# Patient Record
Sex: Female | Born: 2000 | Race: Asian | Hispanic: Yes | Marital: Single | Smoking: Current every day smoker
Health system: Southern US, Community
[De-identification: ages and names within clinical notes are randomized; demographics above are authoritative.]

---

## 2019-09-16 ENCOUNTER — Emergency Department (HOSPITAL_COMMUNITY): Payer: Self-pay

## 2019-09-16 ENCOUNTER — Other Ambulatory Visit: Payer: Self-pay

## 2019-09-16 ENCOUNTER — Encounter (HOSPITAL_COMMUNITY): Payer: Self-pay | Admitting: Emergency Medicine

## 2019-09-16 ENCOUNTER — Emergency Department (HOSPITAL_COMMUNITY)
Admission: EM | Admit: 2019-09-16 | Discharge: 2019-09-16 | Disposition: A | Discharge status: Home / Self Care | Payer: Self-pay | Attending: Emergency Medicine | Admitting: Emergency Medicine

## 2019-09-16 DIAGNOSIS — F17210 Nicotine dependence, cigarettes, uncomplicated: Secondary | ICD-10-CM | POA: Insufficient documentation

## 2019-09-16 DIAGNOSIS — R319 Hematuria, unspecified: Secondary | ICD-10-CM

## 2019-09-16 DIAGNOSIS — R109 Unspecified abdominal pain: Secondary | ICD-10-CM | POA: Insufficient documentation

## 2019-09-16 DIAGNOSIS — N390 Urinary tract infection, site not specified: Secondary | ICD-10-CM | POA: Insufficient documentation

## 2019-09-16 LAB — CBC
HCT: 42.4 % (ref 36.0–46.0)
Hemoglobin: 14.4 g/dL (ref 12.0–15.0)
MCH: 31.3 pg (ref 26.0–34.0)
MCHC: 34 g/dL (ref 30.0–36.0)
MCV: 92.2 fL (ref 80.0–100.0)
Platelets: 197 10*3/uL (ref 150–400)
RBC: 4.6 MIL/uL (ref 3.87–5.11)
RDW: 11.9 % (ref 11.5–15.5)
WBC: 10.3 10*3/uL (ref 4.0–10.5)
nRBC: 0 % (ref 0.0–0.2)

## 2019-09-16 LAB — URINALYSIS, ROUTINE W REFLEX MICROSCOPIC
Bilirubin Urine: NEGATIVE
Glucose, UA: NEGATIVE mg/dL
Ketones, ur: NEGATIVE mg/dL
Nitrite: POSITIVE — AB
Protein, ur: 100 mg/dL — AB
RBC / HPF: >50 RBC/hpf — ABNORMAL HIGH (ref 0–5)
Specific Gravity, Urine: 1.018 (ref 1.005–1.030)
WBC, UA: >50 WBC/hpf — ABNORMAL HIGH (ref 0–5)
pH: 6 (ref 5.0–8.0)

## 2019-09-16 LAB — COMPREHENSIVE METABOLIC PANEL
ALT: 15 U/L (ref 0–44)
AST: 18 U/L (ref 15–41)
Albumin: 4.6 g/dL (ref 3.5–5.0)
Alkaline Phosphatase: 55 U/L (ref 38–126)
Anion gap: 13 (ref 5–15)
BUN: 14 mg/dL (ref 6–20)
CO2: 24 mmol/L (ref 22–32)
Calcium: 9.8 mg/dL (ref 8.9–10.3)
Chloride: 101 mmol/L (ref 98–111)
Creatinine, Ser: 0.85 mg/dL (ref 0.44–1.00)
GFR calc Af Amer: >60 mL/min (ref >60)
GFR calc non Af Amer: >60 mL/min (ref >60)
Glucose, Bld: 100 mg/dL — ABNORMAL HIGH (ref 70–99)
Potassium: 3.8 mmol/L (ref 3.5–5.1)
Sodium: 138 mmol/L (ref 135–145)
Total Bilirubin: 1.1 mg/dL (ref 0.3–1.2)
Total Protein: 7.4 g/dL (ref 6.5–8.1)

## 2019-09-16 LAB — I-STAT BETA HCG BLOOD, ED (MC, WL, AP ONLY): I-stat hCG, quantitative: <5 m[IU]/mL (ref <5)

## 2019-09-16 MED ORDER — KETOROLAC TROMETHAMINE 60 MG/2ML IM SOLN
30.0000 mg | Freq: Once | INTRAMUSCULAR | Status: AC
  Administered 2019-09-16: 30 mg via INTRAMUSCULAR
  Filled 2019-09-16: qty 2

## 2019-09-16 MED ORDER — ONDANSETRON 8 MG PO TBDP: 8.0000 mg | ORAL_TABLET | Freq: Three times a day (TID) | ORAL | 0 refills | Status: AC | PRN

## 2019-09-16 MED ORDER — CEPHALEXIN 500 MG PO CAPS: 500.0000 mg | ORAL_CAPSULE | Freq: Two times a day (BID) | ORAL | 0 refills | Status: AC

## 2019-09-16 NOTE — ED Notes (Signed)
Patient verbalizes understanding of discharge instructions. Opportunity for questioning and answers were provided. Pt discharged from ED. 

## 2019-09-16 NOTE — ED Triage Notes (Signed)
Patient with back pain and dysuria.  She states that it has been going on for about a week.  She has been having urgency and retention.

## 2019-09-16 NOTE — ED Notes (Signed)
Patient transported to CT 

## 2019-09-16 NOTE — ED Provider Notes (Addendum)
[facility] Provider Note   CSN: [id number] Arrival date & time: 09/16/19  0510     History   Chief Complaint Chief Complaint  Patient presents with  . Dysuria    HPI Tachina Spoonemore is a 18 y.o. female.     HPI  18 yo female g0 p0 presents today with right flank pain and dysuria for seven days.  Blood noted in urine for 2-3 days.  LMP 9/26 normal. No history of uti or stone in past.  Flank pain waxes and wanes without provocation.  Pain is sharp with some radiation.  NO nausea, vomiting, fever, or chills. Denies abnormal vaginal discharge or vaginal infection symptoms.    History reviewed. No pertinent past medical history.  There are no active problems to display for this patient.   History reviewed. No pertinent surgical history.   OB History   No obstetric history on file.      Home Medications    Prior to Admission medications   Not on File    Family History No family history on file.  Social History Social History   Tobacco Use  . Smoking status: Current Every Day Smoker  . Smokeless tobacco: Never Used  Substance Use Topics  . Alcohol use: Not on file  . Drug use: Not on file     Allergies   Patient has no known allergies.   Review of Systems Review of Systems   Physical Exam Updated Vital Signs BP 98/81   Pulse 68   Temp 97.9 F (36.6 C) (Oral)   Resp 16   Ht 1.626 m (5\' 4" )   Wt 59.4 kg   LMP 08/28/2019 (Exact Date)   SpO2 100%   BMI 22.49 kg/m   Physical Exam Vitals signs and nursing note reviewed.  Constitutional:      Appearance: She is well-developed.  HENT:     Head: Normocephalic and atraumatic.     Right Ear: External ear normal.     Left Ear: External ear normal.     Nose: Nose normal.  Eyes:     Conjunctiva/sclera: Conjunctivae normal.     Pupils: Pupils are equal, round, and reactive to light.  Neck:     Musculoskeletal: Normal range of motion and neck supple.   Thyroid: No thyromegaly.     Vascular: No JVD.     Trachea: No tracheal deviation.  Cardiovascular:     Rate and Rhythm: Normal rate and regular rhythm.     Heart sounds: Normal heart sounds.  Pulmonary:     Effort: Pulmonary effort is normal.     Breath sounds: Normal breath sounds. No wheezing.  Abdominal:     General: Bowel sounds are normal.     Palpations: Abdomen is soft. There is no mass.     Tenderness: There is no abdominal tenderness. There is right CVA tenderness. There is no guarding.  Musculoskeletal: Normal range of motion.  Lymphadenopathy:     Cervical: No cervical adenopathy.  Skin:    General: Skin is warm and dry.  Neurological:     Mental Status: She is alert and oriented to person, place, and time.     GCS: GCS eye subscore is 4. GCS verbal subscore is 5. GCS motor subscore is 6.     Cranial Nerves: No cranial nerve deficit.     Sensory: No sensory deficit.     Gait: Gait normal.     Deep Tendon Reflexes: Reflexes are  normal and symmetric. Babinski sign absent on the right side. Babinski sign absent on the left side.     Reflex Scores:      Bicep reflexes are 2+ on the right side and 2+ on the left side.      Patellar reflexes are 2+ on the right side and 2+ on the left side.    Comments: Strength is normal and equal throughout. Cranial nerves grossly intact. Patient fluent. No gross ataxia and patient able to ambulate without difficulty.  Psychiatric:        Behavior: Behavior normal.        Thought Content: Thought content normal.        Judgment: Judgment normal.      ED Treatments / Results  Labs (all labs ordered are listed, but only abnormal results are displayed) Labs Reviewed  COMPREHENSIVE METABOLIC PANEL - Abnormal; Notable for the following components:      Result Value   Glucose, Bld 100 (*)    All other components within normal limits  URINALYSIS, ROUTINE W REFLEX MICROSCOPIC - Abnormal; Notable for the following components:   Color,  Urine AMBER (*)    Hgb urine dipstick LARGE (*)    Protein, ur 100 (*)    Nitrite POSITIVE (*)    Leukocytes,Ua TRACE (*)    RBC / HPF >50 (*)    WBC, UA >50 (*)    Bacteria, UA FEW (*)    All other components within normal limits  URINE CULTURE  CBC  POC URINE PREG, ED  I-STAT BETA HCG BLOOD, ED ([facility], [facility], [facility] ONLY)    EKG None  Radiology No results found.  Procedures Procedures (including critical care time)  Medications Ordered in ED Medications  ketorolac (TORADOL) injection 30 mg (has no administration in time range)     Initial Impression / Assessment and Plan / ED Course  I have reviewed the triage vital signs and the nursing notes.  Pertinent labs & imaging results that were available during my care of the patient were reviewed by me and considered in my medical decision making (see chart for details).   CT without evidence of kidney stone.  Patient treated with Keflex for UTI.  She has not had vomiting here and appears stable for discharge.      Final Clinical Impressions(s) / ED Diagnoses   Final diagnoses:  Urinary tract infection with hematuria, site unspecified    ED Discharge Orders    None       Margarita Grizzle, MD 09/16/19 1617    Margarita Grizzle, MD 09/16/19 [phone]

## 2019-09-17 LAB — URINE CULTURE: Culture: >=100000 — AB

## 2019-09-18 ENCOUNTER — Telehealth (HOSPITAL_BASED_OUTPATIENT_CLINIC_OR_DEPARTMENT_OTHER): Payer: Self-pay

## 2019-09-18 NOTE — Telephone Encounter (Signed)
Post ED Visit - Positive Culture Follow-up  Culture report reviewed by antimicrobial stewardship pharmacist: [facility] []  Elenor Quinones, Pharm.D. []  Heide Guile, Pharm.D., BCPS AQ-ID []  Parks Neptune, Pharm.D., BCPS []  Alycia Rossetti, Pharm.D., BCPS []  [place], Pharm.D., BCPS, AAHIVP []  Legrand Como, Pharm.D., BCPS, AAHIVP []  Salome Arnt, PharmD, BCPS []  Johnnette Gourd, PharmD, BCPS []  Hughes Better, PharmD, BCPS []  Leeroy Cha, PharmD []  Laqueta Linden, PharmD, BCPS []  Albertina Parr, PharmD X   Mimi Pham, [place] [facility] []  Leodis Sias, PharmD []  Lindell Spar, PharmD []  Royetta Asal, PharmD []  Graylin Shiver, Rph []  Rema Fendt) Glennon Mac, PharmD []  Arlyn Dunning, PharmD []  Netta Cedars, PharmD []  Dia Sitter, PharmD []  Leone Haven, PharmD []  Gretta Arab, PharmD []  Theodis Shove, PharmD []  Peggyann Juba, PharmD []  Reuel Boom, PharmD   Positive urine culture, >/= 100,000 colonies -> Staphylococcus Saprophyticus  Treated with Cephalexin, organism sensitive to the same and no further patient follow-up is required at this time.  Anne Barrett 09/18/2019, 9:37 AM

## 2021-01-03 IMAGING — CT CT RENAL STONE PROTOCOL
2 of 4 series · 15 of 46 positions shown, 17 images · non-contrast
Comparison: None.

CLINICAL DATA: Flank pain with dysuria.

EXAM:
CT ABDOMEN AND PELVIS WITHOUT CONTRAST
TECHNIQUE: Multidetector CT imaging of the abdomen and pelvis was performed
following the standard protocol without IV contrast.

[Series 3: renal stone 5.0 · axial · 0.70mm/px · z∈[+854,+1240]mm · 12 of 91 slices shown, 14 images]
[im 7/91  soft-tissue]
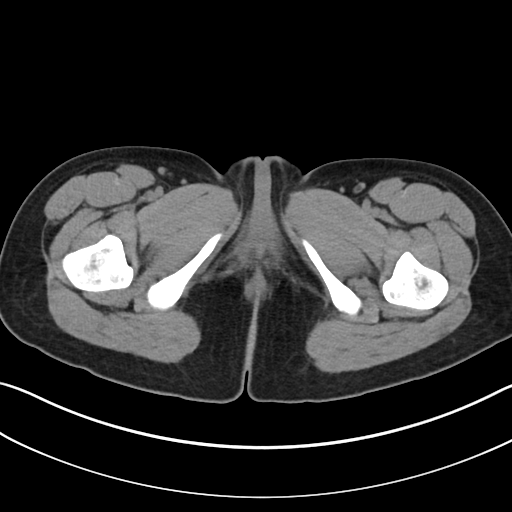
[im 7/91  bone]
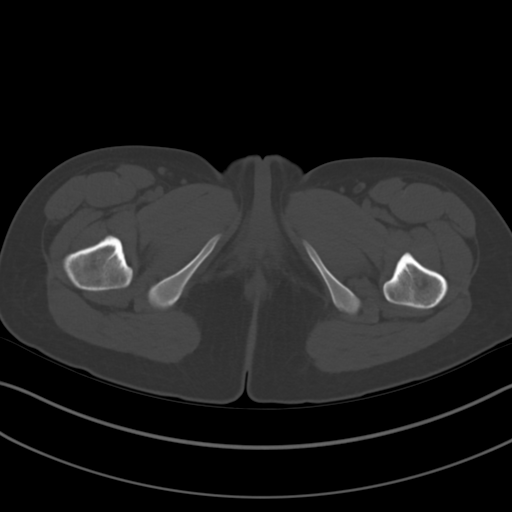
[im 14/91  soft-tissue]
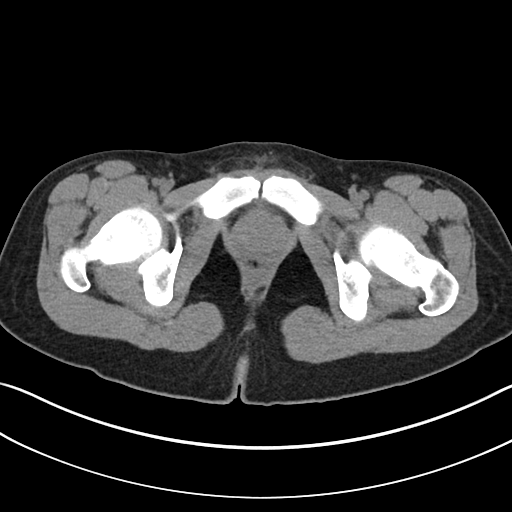
[im 21/91  soft-tissue]
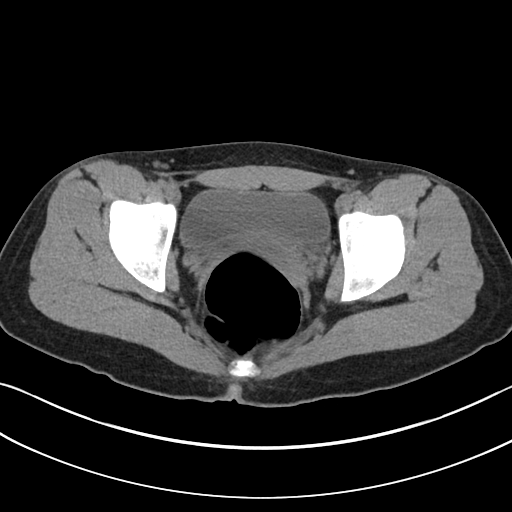
[im 28/91  soft-tissue]
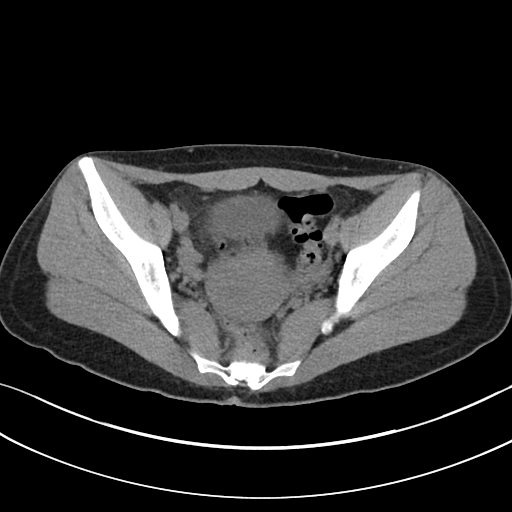
[im 35/91  soft-tissue]
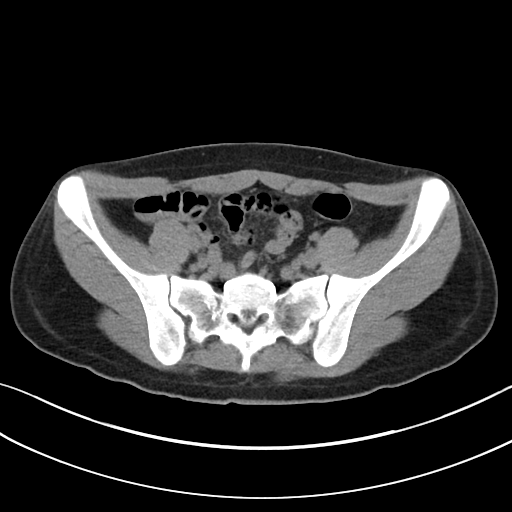
[im 42/91  soft-tissue]
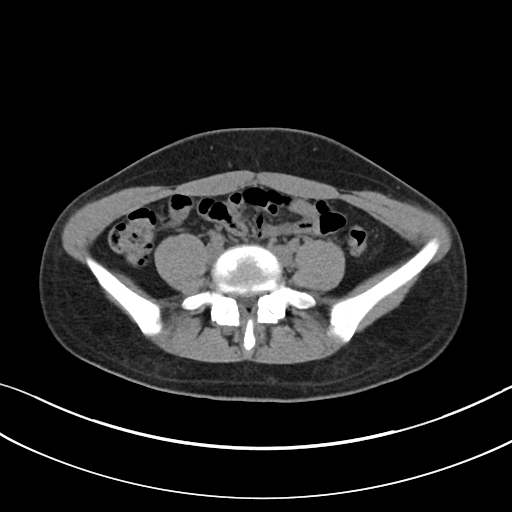
[im 49/91  soft-tissue]
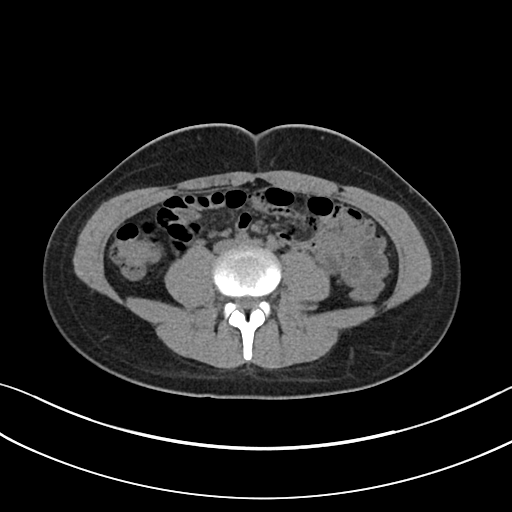
[im 56/91  soft-tissue]
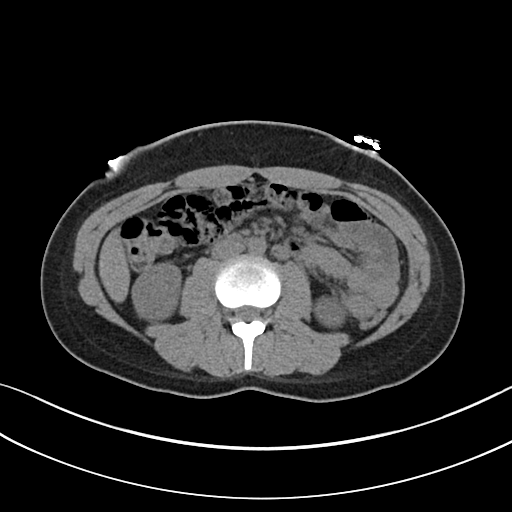
[im 63/91  soft-tissue]
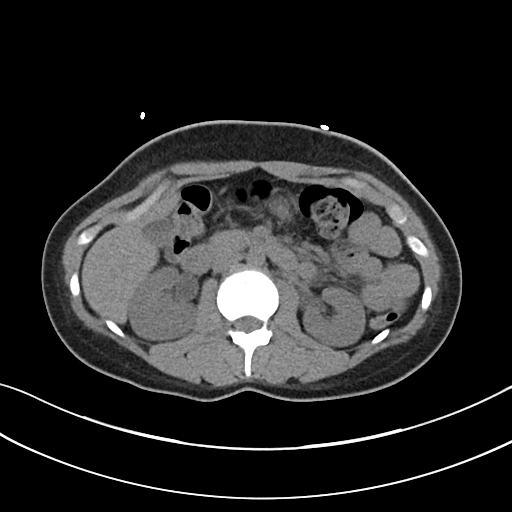
[im 63/91  bone]
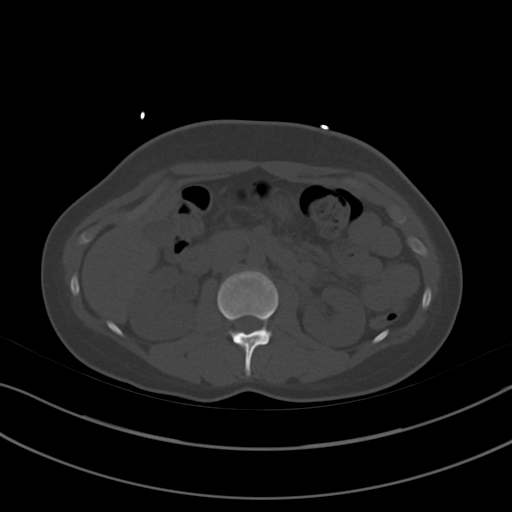
[im 70/91  soft-tissue]
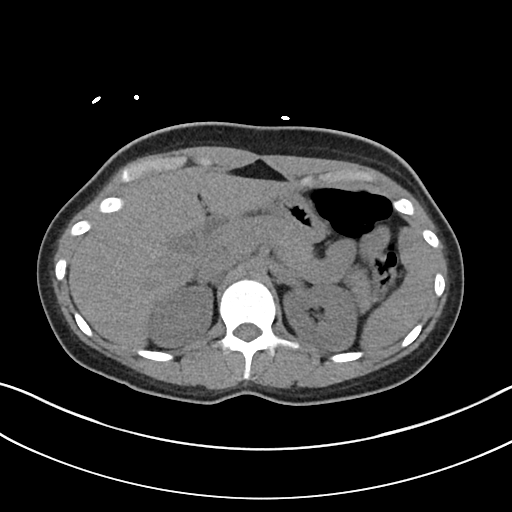
[im 77/91  soft-tissue]
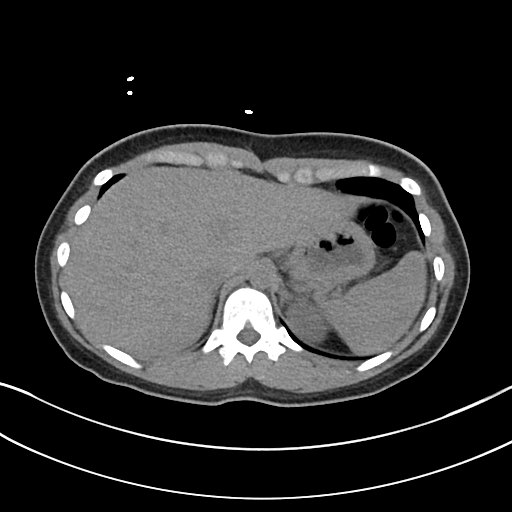
[im 84/91  soft-tissue]
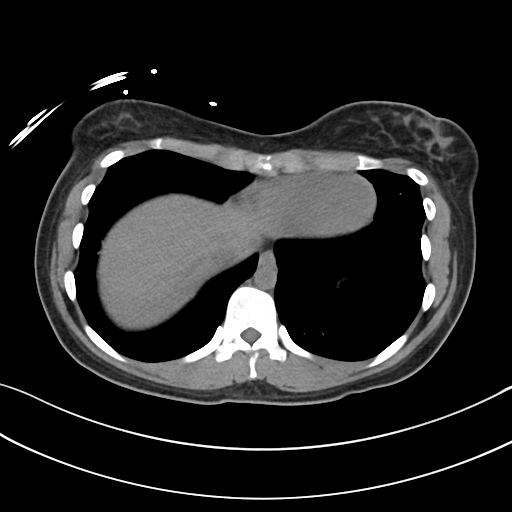

[Series 5: renal stone 3.0 cor · coronal · 0.66mm/px · 3 of 68 slices shown]
[im 23/68  soft-tissue]
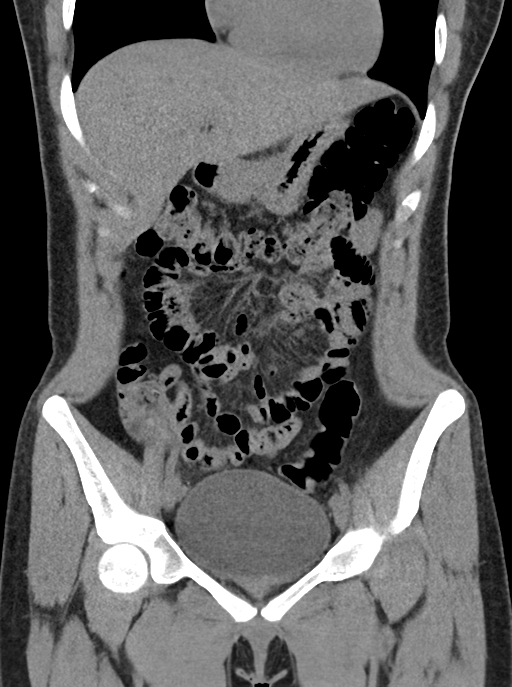
[im 30/68  soft-tissue]
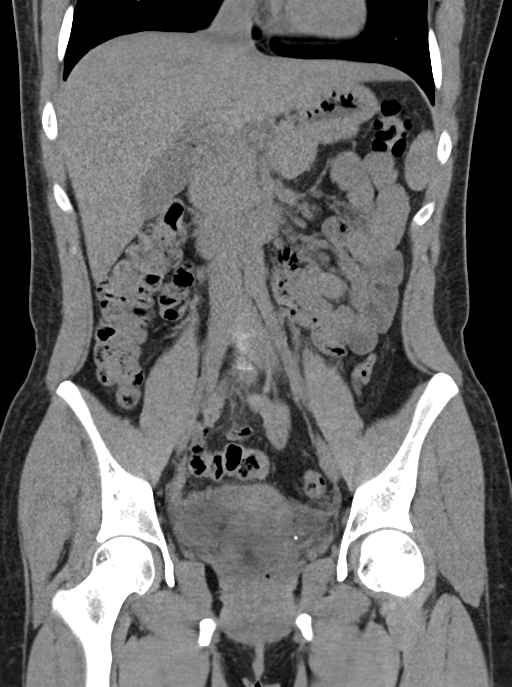
[im 38/68  soft-tissue]
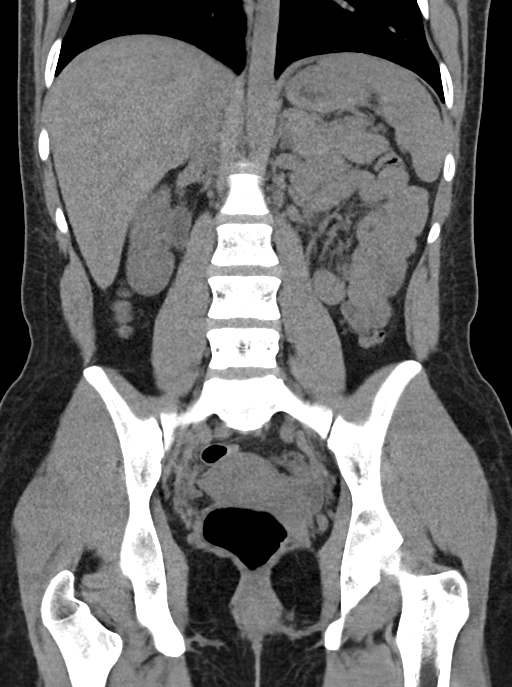

[15 of 46 positions shown; findings below may reference images not displayed]

FINDINGS: Lower chest: Unremarkable.

Hepatobiliary: No focal abnormality in the liver on this study
without intravenous contrast. There is no evidence for gallstones,
gallbladder wall thickening, or pericholecystic fluid. No
intrahepatic or extrahepatic biliary dilation.

Pancreas: No focal mass lesion. No dilatation of the main duct. No
intraparenchymal cyst. No peripancreatic edema.

Spleen: No splenomegaly. No focal mass lesion.

Adrenals/Urinary Tract: No adrenal nodule or mass. No stones are
seen in either kidney or ureter. There is a tiny calcification
posterior to the bladder on the left (72/3) that is likely a
phlebolith given apparent surrounding fat density and lack of
secondary changes in the left kidney or ureter.

Stomach/Bowel: Stomach is unremarkable. No gastric wall thickening.
No evidence of outlet obstruction. Duodenum is normally positioned
as is the ligament of Treitz. No small bowel wall thickening. No
small bowel dilatation. The terminal ileum is normal. Appendix
unremarkable (coronal [DATE]). No gross colonic mass. No colonic wall
thickening.

Vascular/Lymphatic: No abdominal aortic aneurysm. No abdominal
aortic atherosclerotic calcification. There is no gastrohepatic or
hepatoduodenal ligament lymphadenopathy. No intraperitoneal or
retroperitoneal lymphadenopathy. No pelvic sidewall lymphadenopathy.

Reproductive: The uterus is unremarkable.  There is no adnexal mass.

Other: Trace free fluid noted in the pelvis. This can be physiologic
in a premenopausal female.

Musculoskeletal: No worrisome lytic or sclerotic osseous
abnormality.
IMPRESSION: 1. No definite CT findings of urinary stone disease. No secondary
changes in either kidney or ureter. No findings to explain the
patient's history of back pain with dysuria.
